# Patient Record
Sex: Male | Born: 2000 | Race: Black or African American | Hispanic: No | Marital: Single | Smoking: Never smoker
Health system: Southern US, Community
[De-identification: ages and names within clinical notes are randomized; demographics above are authoritative.]

## PROBLEM LIST (undated history)

## (undated) DIAGNOSIS — Z9109 Other allergy status, other than to drugs and biological substances: Secondary | ICD-10-CM

## (undated) DIAGNOSIS — J45909 Unspecified asthma, uncomplicated: Secondary | ICD-10-CM

## (undated) DIAGNOSIS — Z91018 Allergy to other foods: Secondary | ICD-10-CM

## (undated) DIAGNOSIS — T7840XA Allergy, unspecified, initial encounter: Secondary | ICD-10-CM

## (undated) HISTORY — DX: Allergy, unspecified, initial encounter: T78.40XA

## (undated) HISTORY — DX: Allergy to other foods: Z91.018

## (undated) HISTORY — DX: Unspecified asthma, uncomplicated: J45.909

## (undated) HISTORY — PX: ADENOIDECTOMY: SUR15

## (undated) HISTORY — PX: TONSILLECTOMY: SUR1361

## (undated) HISTORY — PX: TYMPANOSTOMY TUBE PLACEMENT: SHX32

---
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
## 2020-01-01 ENCOUNTER — Institutional Professional Consult (permissible substitution): Payer: BC Managed Care – PPO | Admitting: Plastic Surgery

## 2020-01-08 ENCOUNTER — Other Ambulatory Visit: Payer: Self-pay | Admitting: Allergy and Immunology

## 2020-01-08 DIAGNOSIS — J45909 Unspecified asthma, uncomplicated: Secondary | ICD-10-CM

## 2020-01-15 ENCOUNTER — Other Ambulatory Visit: Payer: Self-pay

## 2020-01-15 ENCOUNTER — Encounter: Payer: Self-pay | Admitting: Plastic Surgery

## 2020-01-15 ENCOUNTER — Ambulatory Visit (INDEPENDENT_AMBULATORY_CARE_PROVIDER_SITE_OTHER): Payer: BC Managed Care – PPO | Admitting: Plastic Surgery

## 2020-01-15 VITALS — BP 109/73 | HR 60 | Temp 97.7°F | Ht 67.0 in | Wt 136.4 lb

## 2020-01-15 DIAGNOSIS — L910 Hypertrophic scar: Secondary | ICD-10-CM
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
## 2020-01-22 ENCOUNTER — Other Ambulatory Visit: Payer: Self-pay | Admitting: Allergy and Immunology

## 2020-01-22 DIAGNOSIS — T7800XD Anaphylactic reaction due to unspecified food, subsequent encounter: Secondary | ICD-10-CM

## 2020-01-30 ENCOUNTER — Other Ambulatory Visit: Payer: Self-pay | Admitting: Allergy and Immunology

## 2020-01-30 DIAGNOSIS — J45909 Unspecified asthma, uncomplicated: Secondary | ICD-10-CM

## 2020-02-25 ENCOUNTER — Encounter: Payer: Self-pay | Admitting: Allergy and Immunology

## 2020-02-25 ENCOUNTER — Ambulatory Visit: Payer: BC Managed Care – PPO | Admitting: Allergy and Immunology

## 2020-02-25 ENCOUNTER — Other Ambulatory Visit: Payer: Self-pay

## 2020-02-25 VITALS — BP 114/80 | HR 63 | Resp 16 | Ht 67.0 in | Wt 140.4 lb

## 2020-02-25 DIAGNOSIS — T7800XD Anaphylactic reaction due to unspecified food, subsequent encounter: Secondary | ICD-10-CM | POA: Diagnosis not present

## 2020-02-25 DIAGNOSIS — J4540 Moderate persistent asthma, uncomplicated: Secondary | ICD-10-CM | POA: Diagnosis not present

## 2020-02-25 DIAGNOSIS — J3089 Other allergic rhinitis: Secondary | ICD-10-CM

## 2020-02-25 NOTE — Patient Instructions (Signed)
  1. Continue Advair 230 - 2 inhalations 1-2 times per day depending on disease activity  2. Continue Flonase 1-2 sprays each nostril 3-7 times a week depending on disease activity  3.  If needed:   A. EpiPen, Benadryl, M.D./ER for allergic reaction  B. ProAir HFA 2 puffs every 4-6 hours  C. OTC antihistamine  4.  Obtain fall flu vaccine  5. Return to 6 months or earlier if problem

## 2020-02-25 NOTE — Progress Notes (Signed)
[place] - [place] - [place] - [name] - [place]   Follow-up Note  Referring Provider: Armandina Stammer, MD Primary Provider: Armandina Stammer, MD Date of Office Visit: 02/25/2020  Subjective:   Danny Ballard (DOB: 2000/09/23) is a 19 y.o. male who [facility] in re-evaluation of the following:  HPI: Zaim returns to this clinic in evaluation of asthma and allergic rhinitis and food allergy directed against shellfish, peanuts, tree nuts, egg, and dairy.  His last visit to this clinic was 05 February 2019 at which point time he failed a baked milk challenge.  He continues to avoid dairy and egg and tree nuts and peanuts and shellfish and has not had any allergic reactions and has not had to use a injectable epinephrine device.  His asthma appears to be under very good control while utilizing Advair either 1 or 2 times per day.  Usually during the spring he will utilize this agent twice a day.  He can exercise without any problem and rarely uses a short acting bronchodilator.  Apparently he did develop an episode of "sinusitis/bronchitis" during April 2021 and required an antibiotic but otherwise has not required any additional antibiotics or systemic steroids to treat an airway issue.  He has not been using a nasal steroid very often and occasionally does develop some nasal congestion and sneezing while using some antihistamines.  He does not consume shellfish, peanuts, tree nuts, egg, and dairy.  He obtained two Pfizer Covid vaccinations.  He is now attending [employer] on full [employer].  Allergies as of 02/25/2020      Reactions   Eggs Or Egg-derived Products Hives, Itching, Rash   itching   Peanuts [peanut Oil] Hives   Smelling peanut butter causes hives.    Cantaloupe (diagnostic)    Dairy Aid [lactase] Hives, Itching   Other Other (See Comments)   Walnuts showed up on allergy skin test   Shellfish Allergy Other (See  Comments)   Doctor said to avoid shellfish because of peanut allergy      Medication List    Advair HFA 230-21 MCG/ACT inhaler Generic drug: fluticasone-salmeterol INHALE 2 PUFFS EVERY 12 HOURS TO PREVENT COUGH OR WHEEZE...RINSE, GARGLE, SPIT AFTER USE.   albuterol (2.5 MG/3ML) 0.083% nebulizer solution Commonly known as: PROVENTIL Take 2.5 mg by nebulization every 6 (six) hours as needed for wheezing or shortness of breath.   albuterol 108 (90 Base) MCG/ACT inhaler Commonly known as: VENTOLIN HFA INHALE 2 PUFFS BY MOUTH EVERY 6 HOURS AS NEEDED FOR WHEEZE OR SHORTNESS OF BREATH   cetirizine 10 MG tablet Commonly known as: ZYRTEC Take 10 mg by mouth daily.   EPINEPHrine 0.3 mg/0.3 mL Soaj injection Commonly known as: EPI-PEN USE AS DIRECTED FOR LIFE THREATENING ALLERGIC REACTION       Past Medical History:  Diagnosis Date  . Allergy   . Asthma   . Environmental allergies   . Food allergy    Peanut, Tree Nut, Egg, Milk, Cantaloupe, Shellfish    Past Surgical History:  Procedure Laterality Date  . ADENOIDECTOMY    . LAPAROSCOPIC APPENDECTOMY N/A 01/09/2018   Procedure: APPENDECTOMY LAPAROSCOPIC PEDIATRIC;  Surgeon: Kandice Hams, MD;  Location: [facility] OR;  Service: Pediatrics;  Laterality: N/A;  . TONSILLECTOMY    . TYMPANOSTOMY TUBE PLACEMENT      Review of systems negative except as noted in HPI / PMHx or noted below:  Review of Systems  Constitutional: Negative.  HENT: Negative.   Eyes: Negative.   Respiratory: Negative.   Cardiovascular: Negative.   Gastrointestinal: Negative.   Genitourinary: Negative.   Musculoskeletal: Negative.   Skin: Negative.   Neurological: Negative.   Endo/Heme/Allergies: Negative.   Psychiatric/Behavioral: Negative.      Objective:   Vitals:   02/25/20 1637  BP: 114/80  Pulse: 63  Resp: 16  SpO2: 97%   Height: 5\' 7"  (170.2 cm)  Weight: 140 lb 6.4 oz (63.7 kg)   Physical Exam Constitutional:      Appearance: He is  not diaphoretic.  HENT:     Head: Normocephalic.     Right Ear: Tympanic membrane, ear canal and external ear normal.     Left Ear: Tympanic membrane, ear canal and external ear normal.     Nose: Nose normal. No mucosal edema or rhinorrhea.     Mouth/Throat:     Pharynx: Uvula midline. No oropharyngeal exudate.  Eyes:     Conjunctiva/sclera: Conjunctivae normal.  Neck:     Thyroid: No thyromegaly.     Trachea: Trachea normal. No tracheal tenderness or tracheal deviation.  Cardiovascular:     Rate and Rhythm: Normal rate and regular rhythm.     Heart sounds: Normal heart sounds, S1 normal and S2 normal. No murmur heard.   Pulmonary:     Effort: No respiratory distress.     Breath sounds: Normal breath sounds. No stridor. No wheezing or rales.  Lymphadenopathy:     Head:     Right side of head: No tonsillar adenopathy.     Left side of head: No tonsillar adenopathy.     Cervical: No cervical adenopathy.  Skin:    Findings: No erythema or rash.     Nails: There is no clubbing.  Neurological:     Mental Status: He is alert.     Diagnostics:    Spirometry was performed and demonstrated an FEV1 of 3.49 at 86 % of predicted.   Assessment and Plan:   1. Asthma, moderate persistent, well-controlled   2. Other allergic rhinitis   3. Anaphylactic shock due to food, subsequent encounter     1. Continue Advair 230 - 2 inhalations 1-2 times per day depending on disease activity  2. Continue Flonase 1-2 sprays each nostril 3-7 times a week depending on disease activity  3.  If needed:   A. EpiPen, Benadryl, M.D./ER for allergic reaction  B. ProAir HFA 2 puffs every 4-6 hours  C. OTC antihistamine  4.  Obtain fall flu vaccine  5. Return to 6 months or earlier if problem  Overall Kento is doing very well on his current therapy and he has a very good understanding of his disease state and appropriate use of his medications depending on disease activity.  We will now see him  back in this clinic in 6 months or earlier if there is a problem.  [name], MD Allergy / Immunology [facility]

## 2020-02-26 ENCOUNTER — Encounter: Payer: Self-pay | Admitting: Allergy and Immunology

## 2020-03-18 ENCOUNTER — Other Ambulatory Visit: Payer: Self-pay | Admitting: Allergy and Immunology

## 2020-03-18 DIAGNOSIS — J45909 Unspecified asthma, uncomplicated: Secondary | ICD-10-CM

## 2020-07-29 ENCOUNTER — Ambulatory Visit: Payer: BC Managed Care – PPO | Admitting: Plastic Surgery

## 2020-12-15 ENCOUNTER — Encounter: Payer: Self-pay | Admitting: Allergy and Immunology

## 2020-12-15 ENCOUNTER — Other Ambulatory Visit: Payer: Self-pay

## 2020-12-15 ENCOUNTER — Ambulatory Visit: Payer: BC Managed Care – PPO | Admitting: Allergy and Immunology

## 2020-12-15 VITALS — BP 98/60 | HR 57 | Temp 98.2°F | Resp 16 | Ht 67.0 in | Wt 138.6 lb

## 2020-12-15 DIAGNOSIS — J4540 Moderate persistent asthma, uncomplicated: Secondary | ICD-10-CM

## 2020-12-15 DIAGNOSIS — J45909 Unspecified asthma, uncomplicated: Secondary | ICD-10-CM | POA: Diagnosis not present

## 2020-12-15 DIAGNOSIS — T7800XD Anaphylactic reaction due to unspecified food, subsequent encounter: Secondary | ICD-10-CM

## 2020-12-15 DIAGNOSIS — J3089 Other allergic rhinitis: Secondary | ICD-10-CM

## 2020-12-15 MED ORDER — EPINEPHRINE 0.3 MG/0.3ML IJ SOAJ: 0.3000 mg | INTRAMUSCULAR | 1 refills | Status: DC | PRN

## 2020-12-15 MED ORDER — ALBUTEROL SULFATE HFA 108 (90 BASE) MCG/ACT IN AERS: INHALATION_SPRAY | RESPIRATORY_TRACT | 1 refills | Status: DC

## 2020-12-15 NOTE — Progress Notes (Signed)
[name] - [place] - [name] - [name] - [name]   Follow-up Note  Referring Provider: Armandina Stammer, MD Primary Provider: Armandina Stammer, MD Date of Office Visit: 12/15/2020  Subjective:   Danny Ballard (DOB: 09/01/2000) is a 20 y.o. male who [facility] in re-evaluation of the following:  HPI: Danny Ballard returns to this clinic in evaluation of his multiorgan atopic disease including asthma and allergic rhinitis and food allergy directed against shellfish, peanuts, tree nuts, egg, and dairy.  His last visit to this clinic was 25 February 2020.  Overall he has had an excellent interval of time with his asthma not requiring a systemic steroid and rare use of a short acting bronchodilator and no limitation on ability to exercise while using Advair mostly 1 time per day.  His nose has really been doing very well and has had no need to use any Flonase over the last year.  He remains away from consumption of dairy and egg and peanuts.  He can now eat almonds and cashews and pistachios and walnuts and shrimp and lobster without any problem.  He will be spending the summer in [place] and needs a letter to allow him to bring his EpiPen on the plane.  Allergies as of 12/15/2020      Reactions   Eggs Or Egg-derived Products Hives, Itching, Rash   itching   Peanuts [peanut Oil] Hives   Smelling peanut butter causes hives.    Cantaloupe (diagnostic)    Dairy Aid [lactase] Hives, Itching   Other Other (See Comments)   Walnuts showed up on allergy skin test   Shellfish Allergy Other (See Comments)   Doctor said to avoid shellfish because of peanut allergy   Penicillin G Rash      Medication List      Advair HFA 230-21 MCG/ACT inhaler Generic drug: fluticasone-salmeterol INHALE 2 PUFFS EVERY 12 HOURS TO PREVENT COUGH OR WHEEZE...RINSE, GARGLE, SPIT AFTER USE.   albuterol (2.5 MG/3ML) 0.083% nebulizer solution Commonly known as:  PROVENTIL Take 2.5 mg by nebulization every 6 (six) hours as needed for wheezing or shortness of breath.   albuterol 108 (90 Base) MCG/ACT inhaler Commonly known as: VENTOLIN HFA INHALE 2 PUFFS BY MOUTH EVERY 6 HOURS AS NEEDED FOR WHEEZE OR SHORTNESS OF BREATH   cetirizine 10 MG tablet Commonly known as: ZYRTEC Take 10 mg by mouth daily.   EPINEPHrine 0.3 mg/0.3 mL Soaj injection Commonly known as: EPI-PEN USE AS DIRECTED FOR LIFE THREATENING ALLERGIC REACTION       Past Medical History:  Diagnosis Date  . Allergy   . Asthma   . Environmental allergies   . Food allergy    Peanut, Tree Nut, Egg, Milk, Cantaloupe, Shellfish    Past Surgical History:  Procedure Laterality Date  . ADENOIDECTOMY    . LAPAROSCOPIC APPENDECTOMY N/A 01/09/2018   Procedure: APPENDECTOMY LAPAROSCOPIC PEDIATRIC;  Surgeon: Kandice Hams, MD;  Location: [facility];  Service: Pediatrics;  Laterality: N/A;  . TONSILLECTOMY    . TYMPANOSTOMY TUBE PLACEMENT      Review of systems negative except as noted in HPI / PMHx or noted below:  Review of Systems  Constitutional: Negative.   HENT: Negative.   Eyes: Negative.   Respiratory: Negative.   Cardiovascular: Negative.   Gastrointestinal: Negative.   Genitourinary: Negative.   Musculoskeletal: Negative.   Skin: Negative.   Neurological: Negative.   Endo/Heme/Allergies: Negative.   Psychiatric/Behavioral: Negative.  Objective:   Vitals:   12/15/20 1121  BP: 98/60  Pulse: (!) 57  Resp: 16  Temp: 98.2 F (36.8 C)  SpO2: 99%   Height: 5\' 7"  (170.2 cm)  Weight: 138 lb 9.6 oz (62.9 kg)   Physical Exam Constitutional:      Appearance: He is not diaphoretic.  HENT:     Head: Normocephalic.     Right Ear: Tympanic membrane, ear canal and external ear normal.     Left Ear: Tympanic membrane, ear canal and external ear normal.     Nose: Nose normal. No mucosal edema or rhinorrhea.     Mouth/Throat:     Pharynx: Uvula midline. No  oropharyngeal exudate.  Eyes:     Conjunctiva/sclera: Conjunctivae normal.  Neck:     Thyroid: No thyromegaly.     Trachea: Trachea normal. No tracheal tenderness or tracheal deviation.  Cardiovascular:     Rate and Rhythm: Normal rate and regular rhythm.     Heart sounds: Normal heart sounds, S1 normal and S2 normal. No murmur heard.   Pulmonary:     Effort: No respiratory distress.     Breath sounds: Normal breath sounds. No stridor. No wheezing or rales.  Lymphadenopathy:     Head:     Right side of head: No tonsillar adenopathy.     Left side of head: No tonsillar adenopathy.     Cervical: No cervical adenopathy.  Skin:    Findings: No erythema or rash.     Nails: There is no clubbing.  Neurological:     Mental Status: He is alert.     Diagnostics:    Spirometry was performed and demonstrated an FEV1 of 3.13 at 74 % of predicted.  Assessment and Plan:   1. Asthma, moderate persistent, well-controlled   2. Uncomplicated asthma   3. Anaphylactic shock due to food, subsequent encounter   4. Other allergic rhinitis     1. Continue Advair 230 - 2 inhalations 1-2 times per day depending on disease activity  2. If needed:   A. EpiPen, Benadryl, M.D./ER for allergic reaction  B. ProAir HFA 2 puffs every 4-6 hours  C. OTC antihistamine  D. Flonase 1-2 sprays each nostril 3-7 times a week  3. Return to 12 months or earlier if problem  4. Form for Epi-Pen on airline  Danny Ballard has done very well on his current plan and he has a very good understanding of his disease state and appropriate use of his medications and appropriate dosing of his medications depending on disease activity.  We will now see him back in this clinic in 12 months or earlier if there is a problem why he continues on the plan noted above.  [name], MD Allergy / Immunology [facility]

## 2020-12-15 NOTE — Patient Instructions (Addendum)
  1. Continue Advair 230 - 2 inhalations 1-2 times per day depending on disease activity  2. If needed:   A. EpiPen, Benadryl, M.D./ER for allergic reaction  B. ProAir HFA 2 puffs every 4-6 hours  C. OTC antihistamine  D. Flonase 1-2 sprays each nostril 3-7 times a week  3. Return to 12 months or earlier if problem  4. Form for Epi-Pen on airline

## 2020-12-16 ENCOUNTER — Encounter: Payer: Self-pay | Admitting: Allergy and Immunology

## 2021-02-17 ENCOUNTER — Ambulatory Visit (INDEPENDENT_AMBULATORY_CARE_PROVIDER_SITE_OTHER): Payer: BC Managed Care – PPO | Admitting: Plastic Surgery

## 2021-02-17 ENCOUNTER — Other Ambulatory Visit: Payer: Self-pay

## 2021-02-17 DIAGNOSIS — L910 Hypertrophic scar: Secondary | ICD-10-CM

## 2021-02-17 NOTE — Progress Notes (Signed)
   Referring Provider Armandina Stammer, MD [address] [place],  [place] [address]   CC:  Chief Complaint  Patient presents with   Follow-up      Danny Ballard is an 20 y.o. male.  HPI: Patient presents for follow-up regarding a scar in his left upper lip.  I did inject this with steroid in the past.  He feels like its gotten much better but thinks another injection might be helpful for him.  Review of Systems General: Denies fevers and chills  Physical Exam Vitals with BMI 12/15/2020 02/25/2020 01/15/2020  Height 5\' 7"  5\' 7"  5\' 7"   Weight 138 lbs 10 oz 140 lbs 6 oz 136 lbs 6 oz  BMI 21.7 21.98 21.36  Systolic 98 114 109  Diastolic 60 80 73  Pulse 57 63 60  Some encounter information is confidential and restricted. Go to Review Flowsheets activity to see all data.    General:  No acute distress,  Alert and oriented, Non-Toxic, Normal speech and affect On examination of the left upper lip there is a much improved appearance to his scar.  Its about a centimeter in length and is still a little bit raised but most of the pigmentation has improved.  Its much less noticeable than before.  Assessment/Plan I discussed the various options with him but I do think another steroid injection might offer some benefit.  We discussed the risks and benefits and he wants to move forward.  The areas prepped with an alcohol pad and 0.5 cc of Kenalog 40 was mixed with 0.5 cc lidocaine with epinephrine and this was injected in the lesion.  He tolerated this fine.  We will give him a couple months to see how things settle out and I am happy to reevaluate this down the line if he wants any additional treatments.  We did discuss the potential for direct excision and dermabrasion as other things that might improve the appearance of the scar.  All of his questions were answered.  02/17/2021, 11:05 AM

## 2021-02-21 ENCOUNTER — Other Ambulatory Visit: Payer: Self-pay | Admitting: Allergy and Immunology

## 2021-02-21 DIAGNOSIS — J4540 Moderate persistent asthma, uncomplicated: Secondary | ICD-10-CM

## 2021-06-25 ENCOUNTER — Other Ambulatory Visit: Payer: Self-pay | Admitting: Allergy and Immunology

## 2022-02-11 ENCOUNTER — Telehealth: Payer: Self-pay | Admitting: Allergy and Immunology

## 2022-02-11 NOTE — Telephone Encounter (Signed)
Courtesy refill will be sent in when the patient's mother calls to set up an appointment for a televisit. Patient's mother will call the patient over the weekend to see when he is available for a televisit. The patient listed his sister's number as his home phone and his phone number is disconnected. The only working number we have is the mother's cell.

## 2022-02-11 NOTE — Telephone Encounter (Signed)
Patients mom called requesting refills on inhalers and EpiPen. 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 Once he gets back he has to go straight to college in [place]. Mom states he is not able to come in for an OV until Christmas break. She would like to know if there is any way he could get a courtesy refill.   Best pharmacy[facility] [address]

## 2022-02-15 ENCOUNTER — Telehealth: Payer: Self-pay

## 2022-02-15 ENCOUNTER — Other Ambulatory Visit: Payer: Self-pay

## 2022-02-15 DIAGNOSIS — J4540 Moderate persistent asthma, uncomplicated: Secondary | ICD-10-CM

## 2022-02-15 DIAGNOSIS — T7800XD Anaphylactic reaction due to unspecified food, subsequent encounter: Secondary | ICD-10-CM

## 2022-02-15 MED ORDER — EPINEPHRINE 0.3 MG/0.3ML IJ SOAJ
0.3000 mg | INTRAMUSCULAR | 1 refills | Status: DC | PRN
Start: 2022-02-15 — End: 2022-02-15

## 2022-02-15 MED ORDER — EPINEPHRINE 0.3 MG/0.3ML IJ SOAJ
0.3000 mg | INTRAMUSCULAR | 1 refills | Status: DC | PRN
Start: 2022-02-15 — End: 2022-08-02

## 2022-02-15 MED ORDER — EPINEPHRINE 0.3 MG/0.3ML IJ SOAJ: 0.3000 mg | INTRAMUSCULAR | 1 refills | Status: DC | PRN

## 2022-02-15 MED ORDER — FLUTICASONE-SALMETEROL 230-21 MCG/ACT IN AERO: INHALATION_SPRAY | RESPIRATORY_TRACT | 0 refills | Status: DC

## 2022-02-15 MED ORDER — ALBUTEROL SULFATE HFA 108 (90 BASE) MCG/ACT IN AERS: INHALATION_SPRAY | RESPIRATORY_TRACT | 0 refills | Status: DC

## 2022-02-15 NOTE — Telephone Encounter (Signed)
Patient's mother, Waynetta Sandy called in - DOB verified - NO DPR on file - mother advised patient will need to complete DPR at next visit to be able to continue calling on behalf of patient.   Mom stated she made follow up appts for patient , 03/04/22 @ 3:15 pm w/Dr. Odella Aquas - requesting courtesy medication refills on the following: epi pen; Advair and Ventolin inhaler [facility]   Mom was reminded to make sure patient keeps appt; for future refills. Mom verbalized understanding no further questions.  Electronically sending courtesy medication refills to pharmacy.

## 2022-02-15 NOTE — Telephone Encounter (Signed)
See TE dated 02/15/22. Rx refills sent to pharmacy.

## 2022-03-04 ENCOUNTER — Ambulatory Visit (INDEPENDENT_AMBULATORY_CARE_PROVIDER_SITE_OTHER): Payer: BC Managed Care – PPO | Admitting: Internal Medicine

## 2022-03-04 DIAGNOSIS — J3089 Other allergic rhinitis: Secondary | ICD-10-CM | POA: Diagnosis not present

## 2022-03-04 DIAGNOSIS — T7800XD Anaphylactic reaction due to unspecified food, subsequent encounter: Secondary | ICD-10-CM | POA: Diagnosis not present

## 2022-03-04 DIAGNOSIS — T7800XA Anaphylactic reaction due to unspecified food, initial encounter: Secondary | ICD-10-CM

## 2022-03-04 DIAGNOSIS — J4540 Moderate persistent asthma, uncomplicated: Secondary | ICD-10-CM

## 2022-03-04 NOTE — Progress Notes (Signed)
RE: Danny Ballard MRN: [id number] DOB: 2001/01/19 Date of Telemedicine Visit: 03/04/2022  Referring provider: Armandina Stammer, MD Primary care provider: Armandina Stammer, MD  Chief Complaint: Follow-up   Telemedicine Follow Up Visit via Telephone: I connected with Danny Ballard for a follow up on 03/04/22 by telephone and verified that I am speaking with the correct person using two identifiers.   I discussed the limitations, risks, security and privacy concerns of performing an evaluation and management service by telephone and the availability of in person appointments. I also discussed with the patient that there may be a patient responsible charge related to this service. The patient expressed understanding and agreed to proceed.  Patient is at home accompanied and provided history.  Provider is at the office.  Visit start time: 3:15 PM Visit end time: 3:30 PM Insurance consent/check in by: Johny Drilling Medical consent and medical assistant/nurse: Johny Drilling   History of Present Illness:  He is a 21 y.o. male, who is being followed for asthma, food allergies and allergic rhinitis . His previous allergy office visit was in 12/15/20 with Dr. Lucie Leather.   Asthma/Respiratory Symptom History: Symptoms are well controlled with rare use of albuterol.  Still on advair 2 puffs daily without side effects.  Denies any ED/UC/OCS this past year. He is able to exercise without limitations   Allergic Rhinitis Symptom History: reports symptoms well controlled.  He was able to stop flonase and has rare nasal congestion.   Food Allergy Symptom History: Still avoiding daily, egg and peanuts.  He had a few accidental ingestions of dairy and egg without symptoms.  He will treat with benadryl as needed.  Still avoiding baked and baked milk.    He recently had all medications refilled and does not need any at this time    Otherwise, there have been no changes to his past medical history, surgical history, family  history, or social history.  Assessment and Plan:  Danny Ballard is a 21 y.o. male with:  Moderate persistent asthma without complication  Other allergic rhinitis  Allergy with anaphylaxis due to food - Plan: Milk Component Panel, Egg Component Panel  Patient Instructions   1. Continue Advair 230 - 2 inhalations 1-2 times per day depending on disease activity  2. If needed:   A. EpiPen, Benadryl, M.D./ER for allergic reaction  B. ProAir HFA 2 puffs every 4-6 hours  C. OTC antihistamine  D. Flonase 1-2 sprays each nostril as needed for nasal congestion   3. We will get updated blood work to evaluate for introduction of baked milk and baked egg  4. Return to 12 months or earlier if problem    Diagnostics: None.  Medication List:  Current Outpatient Medications  Medication Sig Dispense Refill   albuterol (PROVENTIL) (2.5 MG/3ML) 0.083% nebulizer solution Take 2.5 mg by nebulization every 6 (six) hours as needed for wheezing or shortness of breath.     albuterol (VENTOLIN HFA) 108 (90 Base) MCG/ACT inhaler INHALE 2 PUFFS BY MOUTH EVERY 6 HOURS AS NEEDED FOR WHEEZE OR SHORTNESS OF BREATH 18 each 0   cetirizine (ZYRTEC) 10 MG tablet Take 10 mg by mouth daily.     EPINEPHrine 0.3 mg/0.3 mL IJ SOAJ injection Inject 0.3 mg into the muscle as needed for anaphylaxis. 2 each 1   fluticasone-salmeterol (ADVAIR HFA) 230-21 MCG/ACT inhaler INHALE 2 PUFFS EVERY 12 HOURS TO PREVENT COUGH OR WHEEZE...RINSE, GARGLE, SPIT AFTER USE. 12 g 0   Current Facility-Administered Medications  Medication Dose Route  Frequency Provider Last Rate Last Admin   EPINEPHrine (ADRENALIN) 1 mg  1 mg Intramuscular Once Kozlow, Alvira Philips, MD       Allergies: Allergies  Allergen Reactions   Eggs Or Egg-Derived Products Hives, Itching and Rash    itching   Peanuts [Peanut Oil] Hives    Smelling peanut butter causes hives.    Cantaloupe (Diagnostic)    Dairy Aid [Tilactase] Hives and Itching   Penicillin G Rash   I  reviewed his past medical history, social history, family history, and environmental history and no significant changes have been reported from previous visits.  Review of Systems  All other systems reviewed and are negative.   Objective:  Physical exam not obtained as encounter was done via telephone.   Previous notes and tests were reviewed.  I discussed the assessment and treatment plan with the patient. The patient was provided an opportunity to ask questions and all were answered. The patient agreed with the plan and demonstrated an understanding of the instructions.   The patient was advised to call back or seek an in-person evaluation if the symptoms worsen or if the condition fails to improve as anticipated.  I provided 5 minutes of non-face-to-face time during this encounter.  It was my pleasure to participate in [place] care today. Please feel free to contact me with any questions or concerns.   Sincerely,  Ferol Luz, MD

## 2022-03-04 NOTE — Patient Instructions (Addendum)
  1. Continue Advair 230 - 2 inhalations 1-2 times per day depending on disease activity  2. If needed:   A. EpiPen, Benadryl, M.D./ER for allergic reaction  B. ProAir HFA 2 puffs every 4-6 hours  C. OTC antihistamine  D. Flonase 1-2 sprays each nostril as needed for nasal congestion   3. We will get updated blood work to evaluate for introduction of baked milk and baked egg  4. Return to 12 months or earlier if problem

## 2022-03-11 ENCOUNTER — Other Ambulatory Visit: Payer: Self-pay | Admitting: Allergy and Immunology

## 2022-06-24 LAB — MILK COMPONENT PANEL
F076-IgE Alpha Lactalbumin: 2.49 kU/L — AB
F077-IgE Beta Lactoglobulin: 0.33 kU/L — AB
F078-IgE Casein: 1.44 kU/L — AB

## 2022-06-24 LAB — EGG COMPONENT PANEL
F232-IgE Ovalbumin: 2.71 kU/L — AB
F233-IgE Ovomucoid: 4.97 kU/L — AB

## 2022-06-28 NOTE — Progress Notes (Signed)
Based on testing I would not recommend oral food challenge to egg yet, but we could consider baked milk food challenge for introduction.  Can someone contact patient and review baked milk protocol and gauge interest in baked milk challenge?  Thanks!!

## 2022-08-02 ENCOUNTER — Other Ambulatory Visit: Payer: Self-pay

## 2022-08-02 ENCOUNTER — Encounter: Payer: Self-pay | Admitting: Allergy and Immunology

## 2022-08-02 ENCOUNTER — Ambulatory Visit: Payer: BC Managed Care – PPO | Admitting: Allergy and Immunology

## 2022-08-02 VITALS — BP 138/88 | HR 70 | Temp 99.7°F | Resp 18 | Ht 67.0 in | Wt 145.8 lb

## 2022-08-02 DIAGNOSIS — T7800XD Anaphylactic reaction due to unspecified food, subsequent encounter: Secondary | ICD-10-CM

## 2022-08-02 DIAGNOSIS — J4540 Moderate persistent asthma, uncomplicated: Secondary | ICD-10-CM | POA: Diagnosis not present

## 2022-08-02 DIAGNOSIS — J3089 Other allergic rhinitis: Secondary | ICD-10-CM

## 2022-08-02 DIAGNOSIS — T7800XA Anaphylactic reaction due to unspecified food, initial encounter: Secondary | ICD-10-CM

## 2022-08-02 MED ORDER — ALBUTEROL SULFATE (2.5 MG/3ML) 0.083% IN NEBU: 2.5000 mg | INHALATION_SOLUTION | RESPIRATORY_TRACT | 1 refills | Status: AC | PRN

## 2022-08-02 MED ORDER — FLUTICASONE-SALMETEROL 230-21 MCG/ACT IN AERO: 2.0000 | INHALATION_SPRAY | Freq: Two times a day (BID) | RESPIRATORY_TRACT | 11 refills | Status: DC

## 2022-08-02 MED ORDER — EPINEPHRINE 0.3 MG/0.3ML IJ SOAJ: 0.3000 mg | INTRAMUSCULAR | 1 refills | Status: AC | PRN

## 2022-08-02 MED ORDER — ALBUTEROL SULFATE HFA 108 (90 BASE) MCG/ACT IN AERS: 2.0000 | INHALATION_SPRAY | RESPIRATORY_TRACT | 1 refills | Status: DC | PRN

## 2022-08-02 NOTE — Progress Notes (Signed)
[place] - [place] - [place] - [name] [facility] [place]   Follow-up Note  Referring Provider: Izola Price, MD Primary Provider: Patient, No Pcp Per Date of Office Visit: 08/02/2022  Subjective:   Danny Ballard (DOB: Mar 10, 2001) is a 22 y.o. male who [facility] in re-evaluation of the following:  HPI: Danny Ballard presents to the clinic in evaluation of asthma, allergic rhinitis, and food hypersensitivity directed against peanut, egg, dairy.  I last saw him in this clinic May 2022 and he visited with Dr. Marlynn Perking 04 March 2022.  He has done very well with his asthma and has not required a systemic steroid to treat an exacerbation and can exercise with out any difficulty and rarely uses a short acting bronchodilator while he uses his combination inhaler about once a day and sometimes once every other day.  He had very little problems with his upper airways while intermittently using a nasal steroid.  He has not required an antibiotic to treat an episode of sinusitis.  He remains away from eating dairy, egg, and peanut.  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  Allergies as of 08/02/2022       Reactions   Eggs Or Egg-derived Products Hives, Itching, Rash   itching   Peanuts [peanut Oil] Hives   Smelling peanut butter causes hives.    Cantaloupe (diagnostic)    Dairy Aid [tilactase] Hives, Itching   Penicillin G Rash        Medication List    albuterol (2.5 MG/3ML) 0.083% nebulizer solution Commonly known as: PROVENTIL Take 2.5 mg by nebulization every 6 (six) hours as needed for wheezing or shortness of breath.   albuterol 108 (90 Base) MCG/ACT inhaler Commonly known as: VENTOLIN HFA INHALE 2 PUFFS BY MOUTH EVERY 6 HOURS AS NEEDED FOR WHEEZE OR SHORTNESS OF BREATH   EPINEPHrine 0.3 mg/0.3 mL Soaj injection Commonly known as: EPI-PEN Inject  0.3 mg into the muscle as needed for anaphylaxis.   fluticasone-salmeterol 230-21 MCG/ACT inhaler Commonly known as: Advair HFA INHALE 2 PUFFS EVERY 12 HOURS TO PREVENT COUGH OR WHEEZE...RINSE, GARGLE, SPIT AFTER USE.    Past Medical History:  Diagnosis Date   Allergy    Asthma    Environmental allergies    Food allergy    Peanut, Tree Nut, Egg, Milk, Cantaloupe, Shellfish    Past Surgical History:  Procedure Laterality Date   ADENOIDECTOMY     LAPAROSCOPIC APPENDECTOMY N/A 01/09/2018   Procedure: APPENDECTOMY LAPAROSCOPIC PEDIATRIC;  Surgeon: Kandice Hams, MD;  Location: [facility] OR;  Service: Pediatrics;  Laterality: N/A;   TONSILLECTOMY     TYMPANOSTOMY TUBE PLACEMENT      Review of systems negative except as noted in HPI / PMHx or noted below:  Review of Systems  Constitutional: Negative.   HENT: Negative.    Eyes: Negative.   Respiratory: Negative.    Cardiovascular: Negative.   Gastrointestinal: Negative.   Genitourinary: Negative.   Musculoskeletal: Negative.   Skin: Negative.   Neurological: Negative.   Endo/Heme/Allergies: Negative.   Psychiatric/Behavioral: Negative.       Objective:   Vitals:   08/02/22 1110  BP: 138/88  Pulse: 70  Resp: 18  Temp: 99.7 F (37.6 C)  SpO2: 99%   Height: 5\' 7"  (170.2 cm)  Weight: 145 lb 12.8 oz (66.1 kg)   Physical Exam Constitutional:  Appearance: He is not diaphoretic.  HENT:     Head: Normocephalic.     Right Ear: Tympanic membrane, ear canal and external ear normal.     Left Ear: Tympanic membrane, ear canal and external ear normal.     Nose: Nose normal. No mucosal edema or rhinorrhea.     Mouth/Throat:     Pharynx: Uvula midline. No oropharyngeal exudate.  Eyes:     Conjunctiva/sclera: Conjunctivae normal.  Neck:     Thyroid: No thyromegaly.     Trachea: Trachea normal. No tracheal tenderness or tracheal deviation.  Cardiovascular:     Rate and Rhythm: Normal rate and regular rhythm.     Heart  sounds: Normal heart sounds, S1 normal and S2 normal. No murmur heard. Pulmonary:     Effort: No respiratory distress.     Breath sounds: Normal breath sounds. No stridor. No wheezing or rales.  Lymphadenopathy:     Head:     Right side of head: No tonsillar adenopathy.     Left side of head: No tonsillar adenopathy.     Cervical: No cervical adenopathy.  Skin:    Findings: No erythema or rash.     Nails: There is no clubbing.  Neurological:     Mental Status: He is alert.     Diagnostics:    Spirometry was performed and demonstrated an FEV1 of 2.42 at 61 % of predicted.  Results of blood tests obtained 21 June 2022 identifies IgE directed against alpha lactalbumin at 2.49 KU/L, beta lactoglobulin 0.33 KU/L, casein 1.44 KU/L, ovalbumin 2.71 KU/L, ovomucoid 4.97 KU/L,  Assessment and Plan:   1. Asthma, moderate persistent, well-controlled   2. Other allergic rhinitis   3. Allergy with anaphylaxis due to food    1. Continue Advair 230 - 2 inhalations 1-2 times per day depending on disease activity  2. If needed:   A. EpiPen, Benadryl, M.D./ER for allergic reaction  B. ProAir HFA 2 puffs every 4-6 hours  C. OTC antihistamine  D. Flonase 1-2 sprays each nostril 3-7 times a week  3. Return to 12 months or earlier if problem  Danny Ballard is doing wonderful and he has a very good understanding of his disease state and how his medications work and appropriate dosing of his medications depending on disease activity and he will continue on the plan noted above and we will see him back in this clinic in 1 year or earlier if there is a problem.  Laurette Schimke, MD Allergy / Immunology [facility]

## 2022-08-02 NOTE — Patient Instructions (Signed)
  1. Continue Advair 230 - 2 inhalations 1-2 times per day depending on disease activity  2. If needed:   A. EpiPen, Benadryl, M.D./ER for allergic reaction  B. ProAir HFA 2 puffs every 4-6 hours  C. OTC antihistamine  D. Flonase 1-2 sprays each nostril 3-7 times a week  3. Return to 12 months or earlier if problem

## 2022-08-03 ENCOUNTER — Encounter: Payer: Self-pay | Admitting: Allergy and Immunology

## 2022-08-05 ENCOUNTER — Other Ambulatory Visit: Payer: Self-pay | Admitting: *Deleted

## 2022-08-05 DIAGNOSIS — N50812 Left testicular pain: Secondary | ICD-10-CM

## 2022-08-08 ENCOUNTER — Ambulatory Visit
Admission: RE | Admit: 2022-08-08 | Discharge: 2022-08-08 | Disposition: A | Discharge status: Home / Self Care | Payer: BC Managed Care – PPO | Source: Ambulatory Visit | Attending: *Deleted | Admitting: *Deleted

## 2022-08-08 DIAGNOSIS — N50812 Left testicular pain: Secondary | ICD-10-CM

## 2022-11-22 ENCOUNTER — Telehealth: Payer: Self-pay | Admitting: Allergy and Immunology

## 2022-11-22 MED ORDER — ALBUTEROL SULFATE HFA 108 (90 BASE) MCG/ACT IN AERS: 2.0000 | INHALATION_SPRAY | RESPIRATORY_TRACT | 1 refills | Status: DC | PRN

## 2022-11-22 NOTE — Telephone Encounter (Signed)
Patient called and said he needs the albuterol inhaler refilled. [place] [facility] [phone].

## 2022-11-22 NOTE — Telephone Encounter (Signed)
Spoke with patient, informed him that refills have been sent to the requested pharmacy. Patient verbalized understanding.

## 2023-03-10 ENCOUNTER — Other Ambulatory Visit: Payer: Self-pay | Admitting: Allergy and Immunology

## 2023-05-20 ENCOUNTER — Other Ambulatory Visit: Payer: Self-pay | Admitting: Allergy and Immunology

## 2023-11-13 ENCOUNTER — Other Ambulatory Visit: Payer: Self-pay | Admitting: Allergy and Immunology

## 2023-11-16 ENCOUNTER — Other Ambulatory Visit: Payer: Self-pay

## 2023-11-16 MED ORDER — FLUTICASONE-SALMETEROL 230-21 MCG/ACT IN AERO: INHALATION_SPRAY | RESPIRATORY_TRACT | 0 refills | Status: AC

## 2024-01-14 ENCOUNTER — Other Ambulatory Visit: Payer: Self-pay | Admitting: Allergy and Immunology

## 2024-01-16 NOTE — Telephone Encounter (Signed)
 PT called to get status of inhaler refill, reviewed with Moira Andrews who advised [facility] filled on 06/15, relayed to PT and he thanked

## 2024-01-16 NOTE — Telephone Encounter (Signed)
 Patient called stating he called the pharmacy to get the medication and the pharmacy told him that a provider needs to sign off on it before he can get it.

## 2024-01-17 NOTE — Telephone Encounter (Signed)
 Valerian called again asking about getting his inhaler filled.
